# Patient Record
Sex: Female | Born: 1980 | Race: White | Hispanic: No | Marital: Single | Smoking: Light tobacco smoker
Health system: Southern US, Community
[De-identification: ages and names within clinical notes are randomized; demographics above are authoritative.]

## PROBLEM LIST (undated history)

## (undated) DIAGNOSIS — M419 Scoliosis, unspecified: Secondary | ICD-10-CM

## (undated) DIAGNOSIS — F419 Anxiety disorder, unspecified: Secondary | ICD-10-CM

## (undated) DIAGNOSIS — R87619 Unspecified abnormal cytological findings in specimens from cervix uteri: Secondary | ICD-10-CM

## (undated) DIAGNOSIS — R519 Headache, unspecified: Secondary | ICD-10-CM

## (undated) DIAGNOSIS — R51 Headache: Secondary | ICD-10-CM

## (undated) HISTORY — DX: Scoliosis, unspecified: M41.9

## (undated) HISTORY — PX: MIDDLE EAR SURGERY: SHX713

## (undated) HISTORY — DX: Unspecified abnormal cytological findings in specimens from cervix uteri: R87.619

## (undated) HISTORY — PX: WISDOM TOOTH EXTRACTION: SHX21

## (undated) HISTORY — PX: NASAL SEPTUM SURGERY: SHX37

## (undated) HISTORY — DX: Anxiety disorder, unspecified: F41.9

---

## 2016-09-24 ENCOUNTER — Encounter: Payer: Self-pay | Admitting: Obstetrics & Gynecology

## 2016-09-24 ENCOUNTER — Ambulatory Visit (INDEPENDENT_AMBULATORY_CARE_PROVIDER_SITE_OTHER): Payer: 59 | Admitting: Obstetrics & Gynecology

## 2016-09-24 VITALS — BP 126/86 | HR 83 | Resp 16 | Ht 69.0 in | Wt 184.0 lb

## 2016-09-24 DIAGNOSIS — N63 Unspecified lump in unspecified breast: Secondary | ICD-10-CM

## 2016-09-24 NOTE — Progress Notes (Signed)
   Subjective:    Patient ID: Kristine Fitzgerald, female    DOB: 05-Feb-1981, 36 y.o.   MRN: [id number]  HPI 36 yo SW G0 here with a question about why she gets lumps in her right breast. Twice this year she has had lumps develop in the right breast. They resolve over a couple of weeks. The most recent one started 2 weeks ago and has essentially disappeared by today. She denies any nipple discharge or skin changes. She does not have a family h/o breast cancer.   Review of Systems     Objective:   Physical Exam [id number] Breathing, conversing, and ambulating normally Her breasts both have some small amount of fibrocystic changes. There is no discrete masses, nipple discharge, or skin changes.       Assessment & Plan:  Resolution of breast lump (s) - reassurance given Rec RTC for annual exam

## 2016-10-23 ENCOUNTER — Encounter: Payer: Self-pay | Admitting: Obstetrics & Gynecology

## 2016-10-23 ENCOUNTER — Ambulatory Visit (INDEPENDENT_AMBULATORY_CARE_PROVIDER_SITE_OTHER): Payer: 59 | Admitting: Obstetrics & Gynecology

## 2016-10-23 VITALS — BP 138/86 | HR 108 | Resp 16 | Ht 68.0 in | Wt 180.0 lb

## 2016-10-23 DIAGNOSIS — Z01419 Encounter for gynecological examination (general) (routine) without abnormal findings: Secondary | ICD-10-CM

## 2016-10-23 DIAGNOSIS — Z1151 Encounter for screening for human papillomavirus (HPV): Secondary | ICD-10-CM

## 2016-10-23 DIAGNOSIS — Z113 Encounter for screening for infections with a predominantly sexual mode of transmission: Secondary | ICD-10-CM

## 2016-10-23 DIAGNOSIS — Z124 Encounter for screening for malignant neoplasm of cervix: Secondary | ICD-10-CM

## 2016-10-23 DIAGNOSIS — R102 Pelvic and perineal pain: Secondary | ICD-10-CM

## 2016-10-23 NOTE — Progress Notes (Signed)
Subjective:    Kristine Fitzgerald is a 36 y.o. SW P0 female who presents for an annual exam. The patient has no complaints today except for long standing occasional dysparuenia.  The patient is not currently sexually active. (abstinent for the last 4 months, boyfriend is being "punished", was not faithful)  GYN screening history: last pap: was abnormal: unsure, years ago, no pap since then. The patient wears seatbelts: yes. The patient participates in regular exercise: yes. Has the patient ever been transfused or tattooed?: yes. The patient reports that there is not domestic violence in her life. ("just my cat scratches me")  Menstrual History: OB History    Gravida Para Term Preterm AB Living   1 0     1     SAB TAB Ectopic Multiple Live Births   1              Menarche age: 52 Patient's last menstrual period was 10/15/2016.    The following portions of the patient's history were reviewed and updated as appropriate: allergies, current medications, past family history, past medical history, past social history, past surgical history and problem list.  Review of Systems Pertinent items are noted in HPI.   Monogamous for 3 years, but he was not faithful [facility] [place] in [place]. [place] Lots of stress at work, been there for a year, started Zoloft due to this job Wants a pregnancy, NOT on a MVI FH- no breast/gyn/colon cancer FP- Dr. Nyra Capes with chronic migraines   Objective:    BP 138/86   Pulse (!) 108   Resp 16   Ht 5\' 8"  (1.727 m)   Wt 180 lb (81.6 kg)   LMP 10/15/2016   BMI 27.37 kg/m   General Appearance:    Alert, cooperative, no distress, appears stated age  Head:    Normocephalic, without obvious abnormality, atraumatic  Eyes:    PERRL, conjunctiva/corneas clear, EOM's intact, fundi    benign, both eyes  Ears:    Normal TM's and external ear canals, both ears  Nose:   Nares normal, septum midline, mucosa normal, no drainage    or sinus tenderness  Throat:   Lips, mucosa,  and tongue normal; teeth and gums normal  Neck:   Supple, symmetrical, trachea midline, no adenopathy;    thyroid:  no enlargement/tenderness/nodules; no carotid   bruit or JVD  Back:     Symmetric, no curvature, ROM normal, no CVA tenderness  Lungs:     Clear to auscultation bilaterally, respirations unlabored  Chest Wall:    No tenderness or deformity   Heart:    Regular rate and rhythm, S1 and S2 normal, no murmur, rub   or gallop  Breast Exam:    No tenderness, masses, or nipple abnormality  Abdomen:     Soft, non-tender, bowel sounds active all four quadrants,    no masses, no organomegaly  Genitalia:    Normal female without lesion, discharge or tenderness     Extremities:   Extremities normal, atraumatic, no cyanosis or edema  Pulses:   2+ and symmetric all extremities  Skin:   Skin color, texture, turgor normal, no rashes or lesions  Lymph nodes:   Cervical, supraclavicular, and axillary nodes normal  Neurologic:   CNII-XII intact, normal strength, sensation and reflexes    throughout  .    Assessment:    Healthy female exam.   Chronic migraines dyspareunia   Plan:   migraine specialist rec'd Gyn u/s Thin prep  pap smear with cotesting STI testing Fasting labs today

## 2016-10-24 LAB — CBC
HEMATOCRIT: 37.1 % (ref 35.0–45.0)
HEMOGLOBIN: 12.1 g/dL (ref 11.7–15.5)
MCH: 28.6 pg (ref 27.0–33.0)
MCHC: 32.6 g/dL (ref 32.0–36.0)
MCV: 87.7 fL (ref 80.0–100.0)
MPV: 9.4 fL (ref 7.5–12.5)
Platelets: 464 10*3/uL — ABNORMAL HIGH (ref 140–400)
RBC: 4.23 MIL/uL (ref 3.80–5.10)
RDW: 16 % — AB (ref 11.0–15.0)
WBC: 9.5 10*3/uL (ref 3.8–10.8)

## 2016-10-24 LAB — COMPREHENSIVE METABOLIC PANEL
ALBUMIN: 4.5 g/dL (ref 3.6–5.1)
ALT: 9 U/L (ref 6–29)
AST: 13 U/L (ref 10–30)
Alkaline Phosphatase: 17 U/L — ABNORMAL LOW (ref 33–115)
BILIRUBIN TOTAL: 0.3 mg/dL (ref 0.2–1.2)
BUN: 16 mg/dL (ref 7–25)
CALCIUM: 9.6 mg/dL (ref 8.6–10.2)
CHLORIDE: 103 mmol/L (ref 98–110)
CO2: 23 mmol/L (ref 20–31)
Creat: 0.9 mg/dL (ref 0.50–1.10)
GLUCOSE: 94 mg/dL (ref 65–99)
POTASSIUM: 4.9 mmol/L (ref 3.5–5.3)
Sodium: 138 mmol/L (ref 135–146)
Total Protein: 7.1 g/dL (ref 6.1–8.1)

## 2016-10-24 LAB — HEPATITIS C ANTIBODY: HCV AB: NEGATIVE

## 2016-10-24 LAB — HEPATITIS B SURFACE ANTIGEN: HEP B S AG: NEGATIVE

## 2016-10-24 LAB — LIPID PANEL
CHOL/HDL RATIO: 3 ratio (ref <5.0)
Cholesterol: 155 mg/dL (ref <200)
HDL: 52 mg/dL (ref >50)
LDL CALC: 78 mg/dL (ref <100)
Triglycerides: 126 mg/dL (ref <150)
VLDL: 25 mg/dL (ref <30)

## 2016-10-24 LAB — TSH: TSH: 0.72 mIU/L

## 2016-10-24 LAB — RPR

## 2016-10-24 LAB — HIV ANTIBODY (ROUTINE TESTING W REFLEX): HIV 1&2 Ab, 4th Generation: NONREACTIVE

## 2016-10-24 NOTE — Addendum Note (Signed)
Addended by: Asencion Islam on: 10/24/2016 02:01 PM   Modules accepted: Orders

## 2016-10-26 LAB — CYTOLOGY - PAP
CHLAMYDIA, DNA PROBE: NEGATIVE
DIAGNOSIS: NEGATIVE
HPV: NOT DETECTED
NEISSERIA GONORRHEA: NEGATIVE

## 2016-10-29 ENCOUNTER — Ambulatory Visit (INDEPENDENT_AMBULATORY_CARE_PROVIDER_SITE_OTHER): Payer: 59

## 2016-10-29 DIAGNOSIS — R938 Abnormal findings on diagnostic imaging of other specified body structures: Secondary | ICD-10-CM

## 2016-10-29 DIAGNOSIS — R102 Pelvic and perineal pain: Secondary | ICD-10-CM

## 2016-11-02 ENCOUNTER — Telehealth: Payer: Self-pay | Admitting: *Deleted

## 2016-11-02 NOTE — Telephone Encounter (Signed)
Pt notified of results of her recent pelvic U/S.  She will call me after 2 periods so that I can reschedule her for a repeat U/S per radiology recommendations.  The U/S will need to be scheduled immediately after finishing her cycle.  She also states that for the last couple of periods the blood flow smells different.  After her next U/S I will schedule her to see Dr Hulan Fray to address her issues and review her U/S to set a plan of care.

## 2016-11-19 ENCOUNTER — Inpatient Hospital Stay (HOSPITAL_COMMUNITY)
Admission: AD | Admit: 2016-11-19 | Discharge: 2016-11-20 | Disposition: A | Discharge status: Home / Self Care | Payer: 59 | Source: Ambulatory Visit | Attending: Emergency Medicine | Admitting: Emergency Medicine

## 2016-11-19 ENCOUNTER — Telehealth: Payer: Self-pay

## 2016-11-19 ENCOUNTER — Encounter (HOSPITAL_COMMUNITY): Payer: Self-pay | Admitting: *Deleted

## 2016-11-19 DIAGNOSIS — R112 Nausea with vomiting, unspecified: Secondary | ICD-10-CM

## 2016-11-19 DIAGNOSIS — D473 Essential (hemorrhagic) thrombocythemia: Secondary | ICD-10-CM

## 2016-11-19 DIAGNOSIS — Z880 Allergy status to penicillin: Secondary | ICD-10-CM | POA: Diagnosis not present

## 2016-11-19 DIAGNOSIS — K3189 Other diseases of stomach and duodenum: Secondary | ICD-10-CM

## 2016-11-19 DIAGNOSIS — K2970 Gastritis, unspecified, without bleeding: Secondary | ICD-10-CM | POA: Diagnosis not present

## 2016-11-19 DIAGNOSIS — D649 Anemia, unspecified: Secondary | ICD-10-CM | POA: Diagnosis not present

## 2016-11-19 DIAGNOSIS — R1031 Right lower quadrant pain: Secondary | ICD-10-CM

## 2016-11-19 DIAGNOSIS — Z79899 Other long term (current) drug therapy: Secondary | ICD-10-CM | POA: Diagnosis not present

## 2016-11-19 DIAGNOSIS — R7989 Other specified abnormal findings of blood chemistry: Secondary | ICD-10-CM | POA: Insufficient documentation

## 2016-11-19 DIAGNOSIS — D75839 Thrombocytosis, unspecified: Secondary | ICD-10-CM

## 2016-11-19 DIAGNOSIS — R109 Unspecified abdominal pain: Secondary | ICD-10-CM

## 2016-11-19 DIAGNOSIS — Z888 Allergy status to other drugs, medicaments and biological substances status: Secondary | ICD-10-CM | POA: Insufficient documentation

## 2016-11-19 HISTORY — DX: Headache: R51

## 2016-11-19 HISTORY — DX: Headache, unspecified: R51.9

## 2016-11-19 LAB — CBC WITH DIFFERENTIAL/PLATELET
Basophils Absolute: 0 10*3/uL (ref 0.0–0.1)
Basophils Relative: 0 %
Eosinophils Absolute: 0.1 10*3/uL (ref 0.0–0.7)
Eosinophils Relative: 1 %
HCT: 36.7 % (ref 36.0–46.0)
Hemoglobin: 11.8 g/dL — ABNORMAL LOW (ref 12.0–15.0)
Lymphocytes Relative: 15 %
Lymphs Abs: 1.9 10*3/uL (ref 0.7–4.0)
MCH: 28.3 pg (ref 26.0–34.0)
MCHC: 32.2 g/dL (ref 30.0–36.0)
MCV: 88 fL (ref 78.0–100.0)
Monocytes Absolute: 0.4 10*3/uL (ref 0.1–1.0)
Monocytes Relative: 3 %
Neutro Abs: 9.8 10*3/uL — ABNORMAL HIGH (ref 1.7–7.7)
Neutrophils Relative %: 81 %
Platelets: 546 10*3/uL — ABNORMAL HIGH (ref 150–400)
RBC: 4.17 MIL/uL (ref 3.87–5.11)
RDW: 14.5 % (ref 11.5–15.5)
WBC: 12.1 10*3/uL — ABNORMAL HIGH (ref 4.0–10.5)

## 2016-11-19 LAB — POCT PREGNANCY, URINE: Preg Test, Ur: NEGATIVE

## 2016-11-19 LAB — URINALYSIS, ROUTINE W REFLEX MICROSCOPIC
Bilirubin Urine: NEGATIVE
Glucose, UA: NEGATIVE mg/dL
Hgb urine dipstick: NEGATIVE
Ketones, ur: NEGATIVE mg/dL
LEUKOCYTES UA: NEGATIVE
Nitrite: NEGATIVE
PROTEIN: NEGATIVE mg/dL
SPECIFIC GRAVITY, URINE: 1.017 (ref 1.005–1.030)
pH: 6 (ref 5.0–8.0)

## 2016-11-19 MED ORDER — HYDROMORPHONE HCL 1 MG/ML IJ SOLN
0.5000 mg | Freq: Once | INTRAMUSCULAR | Status: AC
  Administered 2016-11-19: 0.5 mg via INTRAMUSCULAR
  Filled 2016-11-19: qty 1

## 2016-11-19 MED ORDER — PROMETHAZINE HCL 25 MG PO TABS
12.5000 mg | ORAL_TABLET | Freq: Once | ORAL | Status: AC
  Administered 2016-11-19: 12.5 mg via ORAL
  Filled 2016-11-19: qty 1

## 2016-11-19 NOTE — Telephone Encounter (Signed)
Patient returned call to office stating that she is in so much pain. She is nauseated, and in pain. Patient states it feels like something could "come out through my stomach." Patient LMP last week 11-15-16. Patient instructed if she is in that much pain she should be evaluated at [facility] hospital in [place]. Kathrene Alu RNBSN

## 2016-11-19 NOTE — MAU Note (Signed)
Pt is here with pain on right side, thinks due to enlarged follicle. Pain has been there for months. Had scan in office, [facility].

## 2016-11-19 NOTE — Telephone Encounter (Signed)
Left message for patient to return call to office. Patient called earlier in day with complaints of pain.  Patient recently had imaging done and told to call back after two cycles had been completed to schedule ultrasound. Kathrene Alu RNBSN

## 2016-11-20 ENCOUNTER — Encounter (HOSPITAL_COMMUNITY): Payer: Self-pay | Admitting: Radiology

## 2016-11-20 ENCOUNTER — Inpatient Hospital Stay (HOSPITAL_COMMUNITY): Payer: 59

## 2016-11-20 DIAGNOSIS — R1031 Right lower quadrant pain: Secondary | ICD-10-CM | POA: Diagnosis not present

## 2016-11-20 LAB — COMPREHENSIVE METABOLIC PANEL
ALBUMIN: 4.5 g/dL (ref 3.5–5.0)
ALK PHOS: 21 U/L — AB (ref 38–126)
ALT: 14 U/L (ref 14–54)
ANION GAP: 10 (ref 5–15)
AST: 17 U/L (ref 15–41)
BUN: 12 mg/dL (ref 6–20)
CALCIUM: 9.5 mg/dL (ref 8.9–10.3)
CO2: 28 mmol/L (ref 22–32)
CREATININE: 0.91 mg/dL (ref 0.44–1.00)
Chloride: 101 mmol/L (ref 101–111)
GFR calc Af Amer: >60 mL/min (ref >60)
GFR calc non Af Amer: >60 mL/min (ref >60)
GLUCOSE: 118 mg/dL — AB (ref 65–99)
Potassium: 3.9 mmol/L (ref 3.5–5.1)
SODIUM: 139 mmol/L (ref 135–145)
Total Bilirubin: 0.4 mg/dL (ref 0.3–1.2)
Total Protein: 8.2 g/dL — ABNORMAL HIGH (ref 6.5–8.1)

## 2016-11-20 LAB — LIPASE, BLOOD: Lipase: 25 U/L (ref 11–51)

## 2016-11-20 MED ORDER — IOPAMIDOL (ISOVUE-300) INJECTION 61%
100.0000 mL | Freq: Once | INTRAVENOUS | Status: AC | PRN
  Administered 2016-11-20: 100 mL via INTRAVENOUS

## 2016-11-20 MED ORDER — MORPHINE SULFATE (PF) 2 MG/ML IV SOLN
4.0000 mg | Freq: Once | INTRAVENOUS | Status: AC
  Administered 2016-11-20: 4 mg via INTRAVENOUS
  Filled 2016-11-20: qty 2

## 2016-11-20 MED ORDER — RANITIDINE HCL 150 MG PO TABS: 150.0000 mg | ORAL_TABLET | Freq: Two times a day (BID) | ORAL | 0 refills | Status: AC

## 2016-11-20 MED ORDER — OXYCODONE-ACETAMINOPHEN 5-325 MG PO TABS: 1.0000 | ORAL_TABLET | ORAL | 0 refills | Status: AC | PRN

## 2016-11-20 MED ORDER — METOCLOPRAMIDE HCL 10 MG PO TABS: 10.0000 mg | ORAL_TABLET | Freq: Four times a day (QID) | ORAL | 0 refills | Status: AC | PRN

## 2016-11-20 MED ORDER — DIPHENHYDRAMINE HCL 50 MG/ML IJ SOLN
25.0000 mg | Freq: Once | INTRAMUSCULAR | Status: AC
  Administered 2016-11-20: 25 mg via INTRAVENOUS
  Filled 2016-11-20: qty 1

## 2016-11-20 MED ORDER — SODIUM CHLORIDE 0.9 % IJ SOLN
INTRAMUSCULAR | Status: AC
  Filled 2016-11-20: qty 50

## 2016-11-20 MED ORDER — IOPAMIDOL (ISOVUE-300) INJECTION 61%
INTRAVENOUS | Status: AC
  Filled 2016-11-20: qty 100

## 2016-11-20 MED ORDER — METOCLOPRAMIDE HCL 5 MG/ML IJ SOLN
10.0000 mg | Freq: Once | INTRAMUSCULAR | Status: AC
  Administered 2016-11-20: 10 mg via INTRAVENOUS
  Filled 2016-11-20: qty 2

## 2016-11-20 MED ORDER — SODIUM CHLORIDE 0.9 % IV BOLUS (SEPSIS)
1000.0000 mL | Freq: Once | INTRAVENOUS | Status: AC
  Administered 2016-11-20: 1000 mL via INTRAVENOUS

## 2016-11-20 NOTE — MAU Note (Signed)
[facility] ED and spoke to the charge nurse to inform her that the patient is coming via private vehicle to their facility for RLQ pain, NV, and leukocytosis. Informed the nurse of time of medications as well as vitals, and test ran here at our facility. Pt will be d/c with directions to [place].  Dr.Glick is the accepting physician.

## 2016-11-20 NOTE — Discharge Instructions (Addendum)
Your CT scan showed a probable mass in your stomach. Please make an appointment with a gastroenterologist for further evaluation as an outpatient. Return if symptoms are getting worse.

## 2016-11-20 NOTE — ED Provider Notes (Signed)
[facility] DEPT Provider Note   CSN: [id number] Arrival date & time: 11/19/16  1824     History   Chief Complaint Chief Complaint  Patient presents with  . Abdominal Pain    HPI Kristine Fitzgerald is a 36 y.o. female.  The history is provided by the patient.  She was transferred here from [facility] hospital for further evaluation of abdominal pain. She has had right lower abdominal pain for approximately 1 month, getting worse. There is no radiation of pain. It is much worse with sexual intercourse. There has been some nausea and vomiting. She has had some chills and sweats over the last 2 days, but no documented fevers. She denies dysuria, vaginal discharge. Last menses ended 3 days ago. She had seen her gynecologist 3 weeks ago and had an ultrasound which was negative.  Past Medical History:  Diagnosis Date  . Abnormal Pap smear of cervix   . Anxiety   . Headache   . Scoliosis     There are no active problems to display for this patient.   Past Surgical History:  Procedure Laterality Date  . MIDDLE EAR SURGERY    . NASAL SEPTUM SURGERY    . WISDOM TOOTH EXTRACTION      OB History    Gravida Para Term Preterm AB Living   1 0     1     SAB TAB Ectopic Multiple Live Births   1               Home Medications    Prior to Admission medications   Medication Sig Start Date End Date Taking? Authorizing Provider  acetaminophen (TYLENOL) 325 MG tablet Take 650 mg by mouth every 6 (six) hours as needed.   Yes [provider]  HYDROcodone-acetaminophen (NORCO/VICODIN) 5-325 MG tablet Take by mouth. 09/17/16  Yes [provider]  ranitidine (ZANTAC) 150 MG capsule Take 150 mg by mouth 2 (two) times daily.   Yes [provider]  sertraline (ZOLOFT) 100 MG tablet Take by mouth. 05/30/16 05/30/17 Yes [provider]  verapamil (CALAN) 80 MG tablet Take 1 tablet (80 mg total) by mouth 2 (two) times daily. 04/12/16  Yes [provider]     Family History Family History  Problem Relation Age of Onset  . Diabetes Father   . Hypertension Father     Social History Social History  Substance Use Topics  . Smoking status: Light Tobacco Smoker  . Smokeless tobacco: Never Used  . Alcohol use Yes     Comment: occassionally     Allergies   Clarithromycin; Amoxicillin; Cinnamon; Other; Amoxicillin-pot clavulanate; Drospirenone-ethinyl estradiol; Ibuprofen; Latex; Sulfa antibiotics; and Zofran [ondansetron hcl]   Review of Systems Review of Systems  All other systems reviewed and are negative.    Physical Exam Updated Vital Signs BP 138/82 (BP Location: Left Arm)   Pulse (!) 107   Temp 98.4 F (36.9 C) (Oral)   Resp 14   Ht 5\' 8"  (1.727 m)   Wt 82.1 kg (181 lb)   LMP 11/13/2016 (Exact Date)   SpO2 98%   BMI 27.52 kg/m   Physical Exam  Nursing note and vitals reviewed.  36 year old female, resting comfortably and in no acute distress. Vital signs are significant for tachycardia. Oxygen saturation is 98%, which is normal. Head is normocephalic and atraumatic. PERRLA, EOMI. Oropharynx is clear. Neck is nontender and supple without adenopathy or JVD. Back is nontender and there is no CVA  tenderness. Lungs are clear without rales, wheezes, or rhonchi. Chest is nontender. Heart has regular rate and rhythm without murmur. Abdomen is soft, flat, with moderate tenderness in the left upper quadrant, left lower quadrant, suprapubic area. There is actually no right lower quadrant tenderness. There is no rebound or guarding. There are no masses or hepatosplenomegaly and peristalsis is hypoactive. Extremities have no cyanosis or edema, full range of motion is present. Skin is warm and dry without rash. Neurologic: Mental status is normal, cranial nerves are intact, there are no motor or sensory deficits.  ED Treatments / Results  Labs (all labs ordered are listed, but only abnormal results are displayed) Labs  Reviewed  CBC WITH DIFFERENTIAL/PLATELET - Abnormal; Notable for the following:       Result Value   WBC 12.1 (*)    Hemoglobin 11.8 (*)    Platelets 546 (*)    Neutro Abs 9.8 (*)    All other components within normal limits  COMPREHENSIVE METABOLIC PANEL - Abnormal; Notable for the following:    Glucose, Bld 118 (*)    Total Protein 8.2 (*)    Alkaline Phosphatase 21 (*)    All other components within normal limits  URINALYSIS, ROUTINE W REFLEX MICROSCOPIC  LIPASE, BLOOD  POCT PREGNANCY, URINE   Radiology Ct Abdomen Pelvis W Contrast  Result Date: 11/20/2016 CLINICAL DATA:  Chronic right lower quadrant abdominal pain, nausea and vomiting. Initial encounter. EXAM: CT ABDOMEN AND PELVIS WITH CONTRAST TECHNIQUE: Multidetector CT imaging of the abdomen and pelvis was performed using the standard protocol following bolus administration of intravenous contrast. CONTRAST:  165mL ISOVUE-300 IOPAMIDOL (ISOVUE-300) INJECTION 61% COMPARISON:  Pelvic ultrasound performed 10/29/2016 FINDINGS: Lower chest: Minimal bibasilar atelectasis is noted. The visualized portions of the mediastinum are unremarkable. Hepatobiliary: The liver is unremarkable in appearance. The gallbladder is unremarkable in appearance. The common bile duct remains normal in caliber. Pancreas: The pancreas is within normal limits. Spleen: The spleen is unremarkable in appearance. Adrenals/Urinary Tract: The adrenal glands are unremarkable in appearance. The kidneys are within normal limits. There is no evidence of hydronephrosis. No renal or ureteral stones are identified. No perinephric stranding is seen. Stomach/Bowel: Circumferential wall thickening is noted along the body of the stomach, with scattered adjacent prominent nodes, measuring up to 7 mm in size. This raises concern for malignancy. The appearance is less typical for gastritis. Endoscopy is recommended for further evaluation. The small bowel is within normal limits. The  appendix is normal in caliber, without evidence of appendicitis. The colon is unremarkable in appearance. Vascular/Lymphatic: The abdominal aorta is unremarkable in appearance. The inferior vena cava is grossly unremarkable. No retroperitoneal lymphadenopathy is seen. No pelvic sidewall lymphadenopathy is identified. Reproductive: The bladder is mildly distended grossly unremarkable. The uterus is unremarkable in appearance. A 4.7 cm right adnexal cystic lesion is noted. The ovaries are otherwise grossly unremarkable. Other: No additional soft tissue abnormalities are seen. Musculoskeletal: No acute osseous abnormalities are identified. Vacuum phenomenon is noted at L5-S1. The visualized musculature is unremarkable in appearance. IMPRESSION: 1. Circumferential wall thickening along the body of the stomach, with scattered adjacent prominent nodes, raising concern for gastric malignancy. The appearance is less typical for gastritis. Endoscopy is recommended for further evaluation. 2. 4.7 cm right adnexal cystic lesion is likely physiologic, though pelvic ultrasound could be considered for further evaluation, when and as deemed clinically appropriate. These results were called by telephone at the time of interpretation on 11/20/2016 at 4:27 am to Dr. Shanon Brow  [facility], who verbally acknowledged these results. Electronically Signed   By: Garald Balding M.D.   On: 11/20/2016 04:30    Procedures Procedures (including critical care time)  Medications Ordered in ED Medications  iopamidol (ISOVUE-300) 61 % injection (not administered)  sodium chloride 0.9 % injection (not administered)  promethazine (PHENERGAN) tablet 12.5 mg (12.5 mg Oral Given 11/19/16 2333)  HYDROmorphone (DILAUDID) injection 0.5 mg (0.5 mg Intramuscular Given 11/19/16 2334)  sodium chloride 0.9 % bolus 1,000 mL (0 mLs Intravenous Stopped 11/20/16 0453)  morphine 2 MG/ML injection 4 mg (4 mg Intravenous Given 11/20/16 0256)  metoCLOPramide (REGLAN)  injection 10 mg (10 mg Intravenous Given 11/20/16 0256)  diphenhydrAMINE (BENADRYL) injection 25 mg (25 mg Intravenous Given 11/20/16 0256)  sodium chloride 0.9 % bolus 1,000 mL (0 mLs Intravenous Stopped 11/20/16 0453)  iopamidol (ISOVUE-300) 61 % injection 100 mL (100 mLs Intravenous Contrast Given 11/20/16 0352)     Initial Impression / Assessment and Plan / ED Course  I have reviewed the triage vital signs and the nursing notes.  Pertinent labs & imaging results that were available during my care of the patient were reviewed by me and considered in my medical decision making (see chart for details).  Abdominal pain of uncertain cause. Old records are reviewed confirming recent evaluation by gynecologist with negative pelvic ultrasound (actually, ultrasound did show thickened endometrium, but no adnexal masses or ovarian cysts). Urinalysis was normal. WBC is mildly elevated. We'll check metabolic panel, will send for CT of abdomen and pelvis. I suspect diverticulitis. Consider possibility of appendicitis with abscess.  CT scan shows no pathology in the lower abdomen. There is evidence of thickened gastric wall suggestive of malignancy. She will need referral to gastroenterology. She is discharged with prescription for oxycodone have acetaminophen, Vicryl for mild, and ranitidine. She was not prescribed a proton pump inhibitor because she states that she cannot take omeprazole secondary to side effects which would likely be present in all proton pump inhibitors.  Final Clinical Impressions(s) / ED Diagnoses   Final diagnoses:  RLQ abdominal pain  Intractable vomiting with nausea, unspecified vomiting type  Abdominal pain, unspecified abdominal location  Gastric mass  Normochromic normocytic anemia  Thrombocytosis (HCC)    New Prescriptions New Prescriptions   METOCLOPRAMIDE (REGLAN) 10 MG TABLET    Take 1 tablet (10 mg total) by mouth every 6 (six) hours as needed for nausea (or headache).    OXYCODONE-ACETAMINOPHEN (PERCOCET) 5-325 MG TABLET    Take 1 tablet by mouth every 4 (four) hours as needed for moderate pain.   RANITIDINE (ZANTAC) 150 MG TABLET    Take 1 tablet (150 mg total) by mouth 2 (two) times daily.     Delora Fuel, MD 91/47/82 [phone]

## 2016-11-22 NOTE — Progress Notes (Addendum)
Patient ID: Kristine Fitzgerald, female   DOB: [birth year], 36 y.o.   MRN: [id number]  CSN: [id number]  Arrival date and time: 11/19/16 6546   First Provider Initiated Contact with Patient 11/19/16 2254      No chief complaint on file.  HPI  Ms. Kristine Fitzgerald is a 36 y.o. G1P0010 who presents to MAU today with complaint of RLQ abdominal pain for > 1 month. The patient was seen for an annual exam at [facility] and had [place] to assess this pain. The patient states that she feels pain is worsening and that Tylenol and Norco do not help. She has also had new onset N/V and a few loose stools recently. She denies fever or UTI symptoms. She has not taken any anti-emetics yet. She states continued dyspareunia, but then states that she has not been sexually active in months. She states regular periods, but very heavy with clots. LMP 11/13/16.             OB History    Gravida Para Term Preterm AB Living   1 0     1     SAB TAB Ectopic Multiple Live Births   1                  Past Medical History:  Diagnosis Date  . Abnormal Pap smear of cervix   . Anxiety   . Headache   . Scoliosis          Past Surgical History:  Procedure Laterality Date  . MIDDLE EAR SURGERY    . NASAL SEPTUM SURGERY    . WISDOM TOOTH EXTRACTION           Family History  Problem Relation Age of Onset  . Diabetes Father   . Hypertension Father            Social History   Substance Use Topics   . Smoking status: Light Tobacco Smoker   . Smokeless tobacco: Never Used   . Alcohol use Yes     Comment: occassionally     Allergies:       Allergies  Allergen Reactions  . Clarithromycin Nausea Only    Made very nauseous  . Amoxicillin Other (See Comments)    Pt states it caused vaginal bleeding  . Cinnamon Other (See Comments)    Causes migraines  . Other Rash    Head and shoulders shampoo  . Amoxicillin-Pot Clavulanate Nausea And Vomiting  . Drospirenone-Ethinyl  Estradiol     "mean & crying spells"  . Ibuprofen     In higher doses causes blistering of hands  . Latex Dermatitis  . Sulfa Antibiotics Hives  . Zofran [Ondansetron Hcl]     Vertigo            Prescriptions Prior to Admission  Medication Sig Dispense Refill Last Dose  . acetaminophen (TYLENOL) 325 MG tablet Take 650 mg by mouth every 6 (six) hours as needed.   11/19/2016 at Unknown time  . HYDROcodone-acetaminophen (NORCO/VICODIN) 5-325 MG tablet Take by mouth.   Past Week at Unknown time  . ranitidine (ZANTAC) 150 MG capsule Take 150 mg by mouth 2 (two) times daily.   11/19/2016 at Unknown time  . sertraline (ZOLOFT) 100 MG tablet Take by mouth.   11/19/2016 at Unknown time  . verapamil (CALAN) 80 MG tablet Take 1 tablet (80 mg total) by mouth 2 (two) times daily.   Past Week at Unknown time  . meloxicam (MOBIC) 15  MG tablet TAKE 1/2-1 TABLET BY MOUTH DAILY WITH FOOD   Taking    Review of Systems  Constitutional: Negative for fever and malaise/fatigue.  Gastrointestinal: Positive for abdominal pain, nausea and vomiting. Negative for constipation and diarrhea.  Genitourinary: Negative for dysuria, frequency and urgency.    Physical Exam   Blood pressure 138/85, pulse 82, temperature 98.5 F (36.9 C), temperature source Oral, resp. rate 20, height 5' 8.5" (1.74 m), weight 181 lb (82.1 kg), last menstrual period 11/13/2016, SpO2 100 %.  Physical Exam  Nursing note and vitals reviewed. Constitutional: She is oriented to person, place, and time. She appears well-developed and well-nourished. No distress.  HENT:  Head: Normocephalic and atraumatic.  Cardiovascular: Normal rate.   Respiratory: Effort normal.  GI: Soft. She exhibits no distension and no mass. There is tenderness (moderate RLQ abdominal tenderness to palpation). There is no rebound and no guarding.  Neurological: She is alert and oriented to person, place, and time.  Skin: Skin is warm and  dry. No erythema.  Psychiatric: She has a normal mood and affect.    Results for orders placed or performed during the hospital encounter of 11/19/16 (from the past 72 hour(s))  Urinalysis, Routine w reflex microscopic     Status: None   Collection Time: 11/19/16  7:21 PM  Result Value Ref Range   Color, Urine YELLOW YELLOW   APPearance CLEAR CLEAR   Specific Gravity, Urine 1.017 1.005 - 1.030   pH 6.0 5.0 - 8.0   Glucose, UA NEGATIVE NEGATIVE mg/dL   Hgb urine dipstick NEGATIVE NEGATIVE   Bilirubin Urine NEGATIVE NEGATIVE   Ketones, ur NEGATIVE NEGATIVE mg/dL   Protein, ur NEGATIVE NEGATIVE mg/dL   Nitrite NEGATIVE NEGATIVE   Leukocytes, UA NEGATIVE NEGATIVE  Pregnancy, urine POC     Status: None   Collection Time: 11/19/16  7:50 PM  Result Value Ref Range   Preg Test, Ur NEGATIVE NEGATIVE    Comment:        THE SENSITIVITY OF THIS METHODOLOGY IS >24 mIU/mL   CBC with Differential/Platelet     Status: Abnormal   Collection Time: 11/19/16 11:35 PM  Result Value Ref Range   WBC 12.1 (H) 4.0 - 10.5 K/uL   RBC 4.17 3.87 - 5.11 MIL/uL   Hemoglobin 11.8 (L) 12.0 - 15.0 g/dL   HCT 36.7 36.0 - 46.0 %   MCV 88.0 78.0 - 100.0 fL   MCH 28.3 26.0 - 34.0 pg   MCHC 32.2 30.0 - 36.0 g/dL   RDW 14.5 11.5 - 15.5 %   Platelets 546 (H) 150 - 400 K/uL   Neutrophils Relative % 81 %   Neutro Abs 9.8 (H) 1.7 - 7.7 K/uL   Lymphocytes Relative 15 %   Lymphs Abs 1.9 0.7 - 4.0 K/uL   Monocytes Relative 3 %   Monocytes Absolute 0.4 0.1 - 1.0 K/uL   Eosinophils Relative 1 %   Eosinophils Absolute 0.1 0.0 - 0.7 K/uL   Basophils Relative 0 %   Basophils Absolute 0.0 0.0 - 0.1 K/uL  Comprehensive metabolic panel     Status: Abnormal   Collection Time: 11/20/16  2:53 AM  Result Value Ref Range   Sodium 139 135 - 145 mmol/L   Potassium 3.9 3.5 - 5.1 mmol/L   Chloride 101 101 - 111 mmol/L   CO2 28 22 - 32 mmol/L   Glucose, Bld 118 (H) 65 - 99 mg/dL   BUN 12 6 - 20 mg/dL  Creatinine, Ser  0.91 0.44 - 1.00 mg/dL   Calcium 9.5 8.9 - 10.3 mg/dL   Total Protein 8.2 (H) 6.5 - 8.1 g/dL   Albumin 4.5 3.5 - 5.0 g/dL   AST 17 15 - 41 U/L   ALT 14 14 - 54 U/L   Alkaline Phosphatase 21 (L) 38 - 126 U/L   Total Bilirubin 0.4 0.3 - 1.2 mg/dL   GFR calc non Af Amer >60 >60 mL/min   GFR calc Af Amer >60 >60 mL/min    Comment: (NOTE) The eGFR has been calculated using the CKD EPI equation. This calculation has not been validated in all clinical situations. eGFR's persistently <60 mL/min signify possible Chronic Kidney Disease.    Anion gap 10 5 - 15  Lipase, blood     Status: None   Collection Time: 11/20/16  2:53 AM  Result Value Ref Range   Lipase 25 11 - 51 U/L    MAU Course  Procedures None  MDM UPT - negative UA, CBC today  Phenergan and dilaudid given. Patient reports minimal improvement.  Discussed patient with Dr. Roxanne Mins at [facility]. He accepts patient transfer by POV.   Assessment and Plan  A: RLQ abdominal pain Nausea and vomiting  Leukocytosis   P: Discharge home Continue current pain medications Warning signs for worsening condition discussed Patient advised to follow-up with [facility] for further evaluation and management. They will call her with an appointment.  Patient advised to go directly to [facility] for further evaluation and management of RLQ pain tonight  Patient may return to MAU as needed or if her condition were to change or worsen   Kerry Hough, PA-C 11/19/2016, 10:54 PM

## 2016-11-27 ENCOUNTER — Encounter: Payer: Self-pay | Admitting: General Practice

## 2016-12-11 ENCOUNTER — Ambulatory Visit (INDEPENDENT_AMBULATORY_CARE_PROVIDER_SITE_OTHER): Payer: 59 | Admitting: Obstetrics & Gynecology

## 2016-12-11 ENCOUNTER — Encounter: Payer: Self-pay | Admitting: Obstetrics & Gynecology

## 2016-12-11 VITALS — BP 144/88 | HR 101 | Resp 16 | Ht 68.0 in | Wt 188.0 lb

## 2016-12-11 DIAGNOSIS — R938 Abnormal findings on diagnostic imaging of other specified body structures: Secondary | ICD-10-CM | POA: Diagnosis not present

## 2016-12-11 DIAGNOSIS — Z3202 Encounter for pregnancy test, result negative: Secondary | ICD-10-CM

## 2016-12-11 DIAGNOSIS — R9389 Abnormal findings on diagnostic imaging of other specified body structures: Secondary | ICD-10-CM

## 2016-12-11 LAB — POCT URINE PREGNANCY: Preg Test, Ur: NEGATIVE

## 2016-12-11 NOTE — Progress Notes (Signed)
   Subjective:    Patient ID: Kristine Fitzgerald, female    DOB: 16-Jul-1980, 36 y.o.   MRN: [id number]  HPI 36 yo SW P0 here for an embx. She was seen at the [facility] 11-20-16 for RLQ. She had an u/s that showed a thickened endometrium, 20 mm. She is currently having a very heavy period.   Review of Systems     Objective:   Physical Exam Well nourished, well hydrated white female, no apparent distress Breathing, conversing, and ambulating normally  UPT negative, consent signed, time out done Cervix prepped with betadine and grasped with a single tooth tenaculum Uterus sounded to 8 cm Pipelle used for 2 passes with a moderate amount of tissue obtained. She tolerated the procedure well.      Assessment & Plan:  Thickened endometrium- await pathology

## 2016-12-18 ENCOUNTER — Telehealth: Payer: Self-pay

## 2016-12-18 NOTE — Telephone Encounter (Signed)
Left message for patient to return call to office in regard to EMB results (normal) Kathrene Alu RNBSN

## 2016-12-24 ENCOUNTER — Encounter: Payer: Self-pay | Admitting: Obstetrics & Gynecology

## 2016-12-24 ENCOUNTER — Ambulatory Visit (INDEPENDENT_AMBULATORY_CARE_PROVIDER_SITE_OTHER): Payer: 59 | Admitting: Obstetrics & Gynecology

## 2016-12-24 VITALS — BP 154/88 | HR 125 | Ht 69.0 in | Wt 193.0 lb

## 2016-12-24 DIAGNOSIS — R102 Pelvic and perineal pain: Secondary | ICD-10-CM

## 2016-12-24 DIAGNOSIS — G8929 Other chronic pain: Secondary | ICD-10-CM | POA: Diagnosis not present

## 2016-12-24 NOTE — Progress Notes (Signed)
   Subjective:    Patient ID: Kristine Fitzgerald, female    DOB: 08-08-1980, 36 y.o.   MRN: [id number]  HPI 36 yo SW G0 here today for results of her EMBX done for thickened endometrium seen on u/s 10/29/16 which was done for RLQ pain for about 3 years. Sex has been painful for at least 5 years, getting worse. The pain is daily, worse with her periods. She takes nothing for pain because it doesn't work. She is taking Norco 5 mg daily for her back pain.  She says that she tried Yasmin but she stopped them because her family said that she was crying daily, "evil bitch".  She has not had sex for 6 months due to the pain. She would like a pregnancy, possibly.   Review of Systems     Objective:   Physical Exam Breathing, conversing, and ambulating normally Well nourished, well hydrated white female, tearful    Assessment & Plan:  Chronic pelvic pain, dyspareunia, dysmenorrhea- I have offered her a dx laparoscopy. She would this done.

## 2016-12-25 ENCOUNTER — Encounter (HOSPITAL_COMMUNITY): Payer: Self-pay

## 2016-12-26 ENCOUNTER — Ambulatory Visit: Payer: 59 | Admitting: Medical

## 2017-01-08 ENCOUNTER — Encounter (HOSPITAL_COMMUNITY): Admission: RE | Payer: Self-pay | Source: Ambulatory Visit

## 2017-01-08 ENCOUNTER — Ambulatory Visit (HOSPITAL_COMMUNITY): Admission: RE | Admit: 2017-01-08 | Payer: 59 | Source: Ambulatory Visit | Admitting: Obstetrics & Gynecology

## 2017-01-08 SURGERY — LAPAROSCOPY, DIAGNOSTIC
Anesthesia: Choice

## 2017-01-11 ENCOUNTER — Ambulatory Visit: Payer: 59 | Admitting: Medical

## 2019-04-12 IMAGING — CT CT ABD-PELV W/ CM
2 of 4 series · 15 of 46 positions shown, 17 images · IV contrast (ISOVUE)
Comparison: Pelvic ultrasound performed 10/29/2016

CLINICAL DATA: Chronic right lower quadrant abdominal pain, nausea
and vomiting. Initial encounter.

EXAM:
CT ABDOMEN AND PELVIS WITH CONTRAST
TECHNIQUE: Multidetector CT imaging of the abdomen and pelvis was performed
using the standard protocol following bolus administration of
intravenous contrast.
CONTRAST:  100mL [id number] IOPAMIDOL ([id number]) INJECTION 61%

[Series 2: abd/pel with · axial · 0.74mm/px · z∈[-489,-49]mm · 12 of 98 slices shown, 14 images]
[im 5/98  soft-tissue]
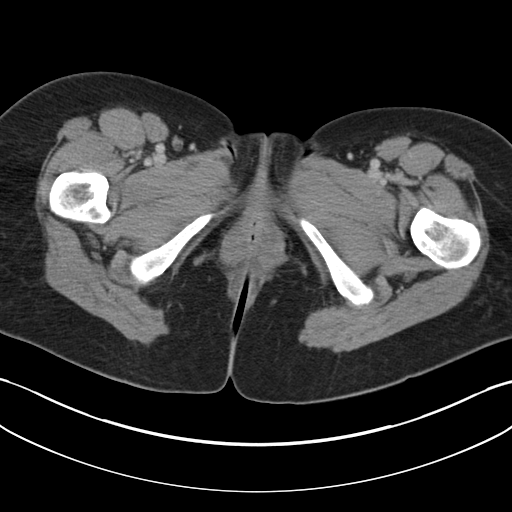
[im 5/98  bone]
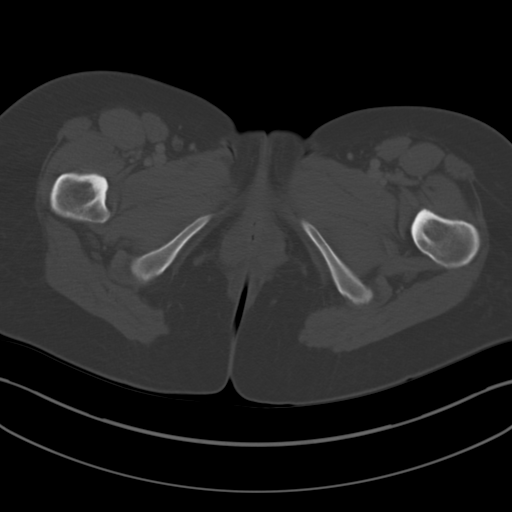
[im 14/98  soft-tissue]
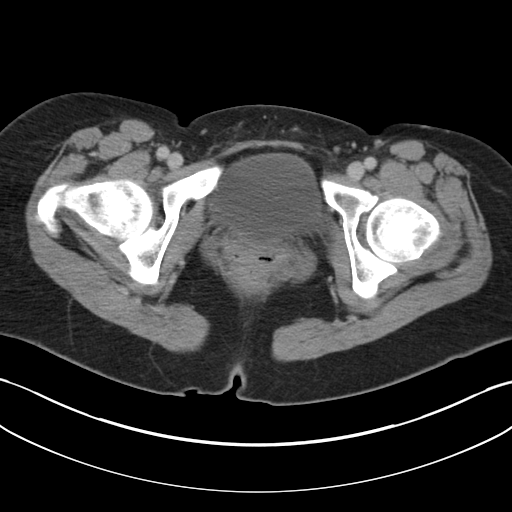
[im 24/98  soft-tissue]
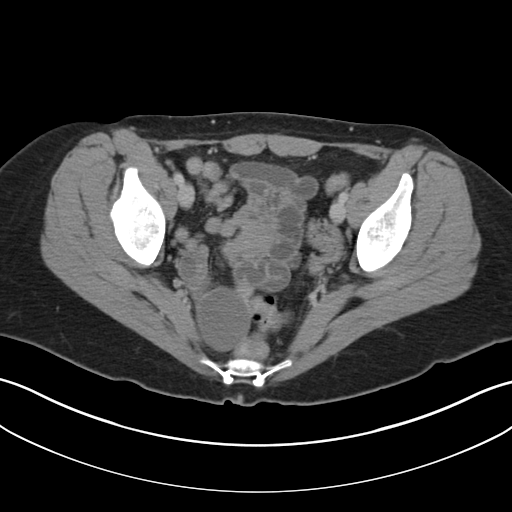
[im 28/98  soft-tissue]
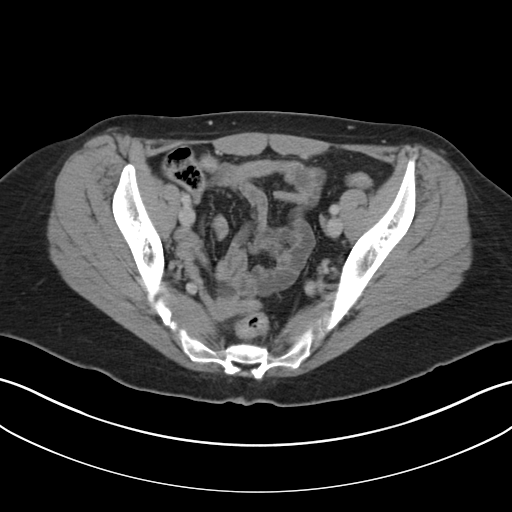
[im 37/98  soft-tissue]
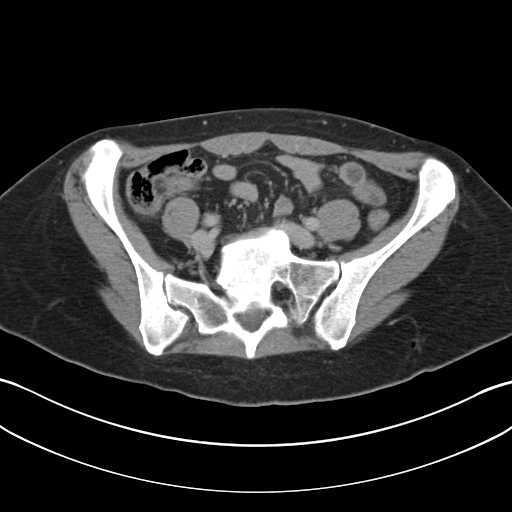
[im 47/98  soft-tissue]
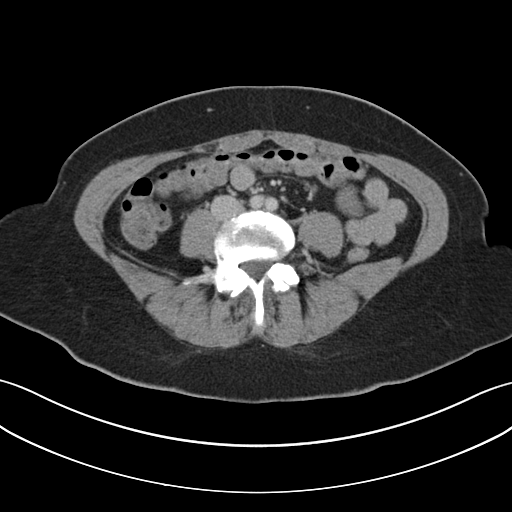
[im 51/98  soft-tissue]
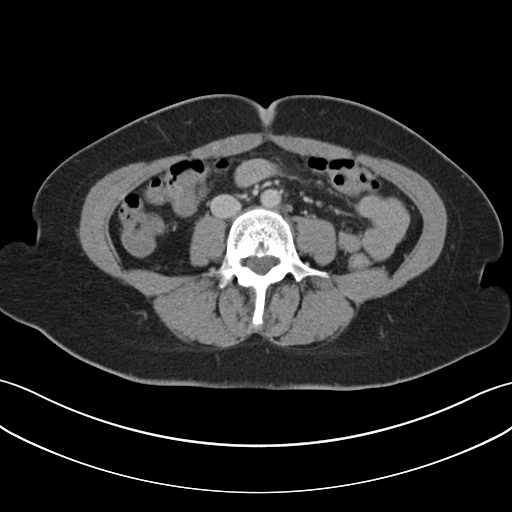
[im 61/98  soft-tissue]
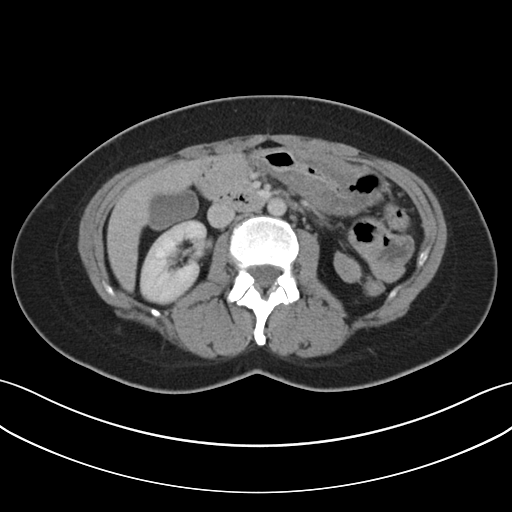
[im 70/98  soft-tissue]
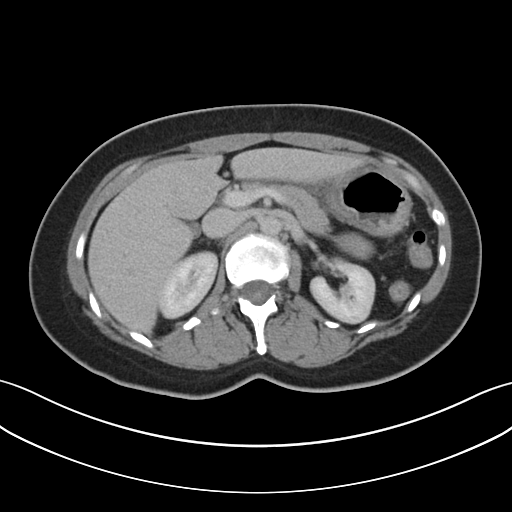
[im 70/98  bone]
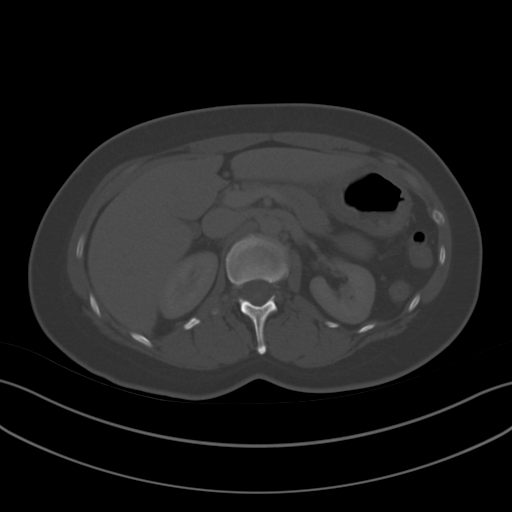
[im 74/98  soft-tissue]
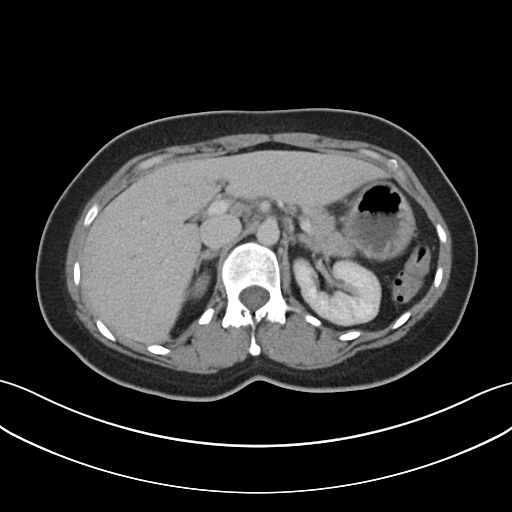
[im 84/98  soft-tissue]
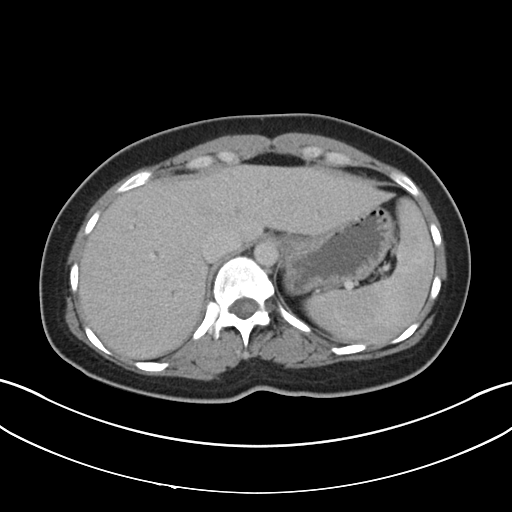
[im 93/98  soft-tissue]
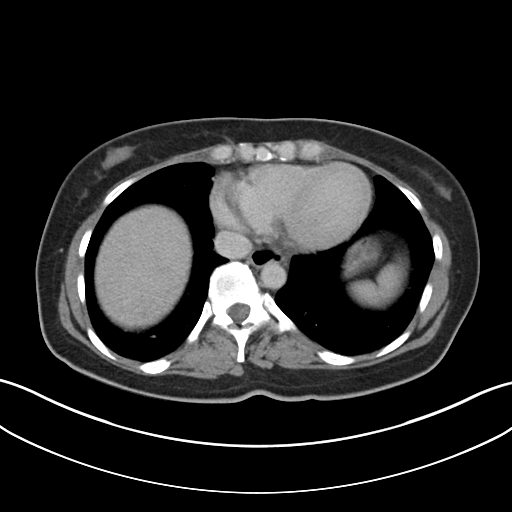

[Series 3: coronal a/|p · coronal · 0.74mm/px · 3 of 104 slices shown]
[im 35/104  soft-tissue]
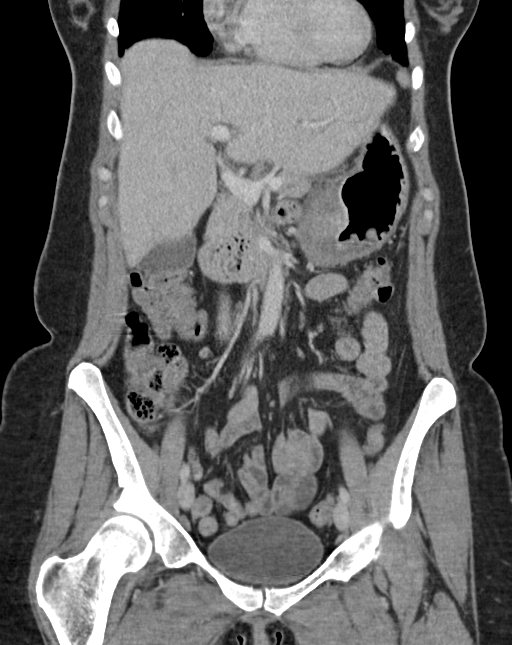
[im 46/104  soft-tissue]
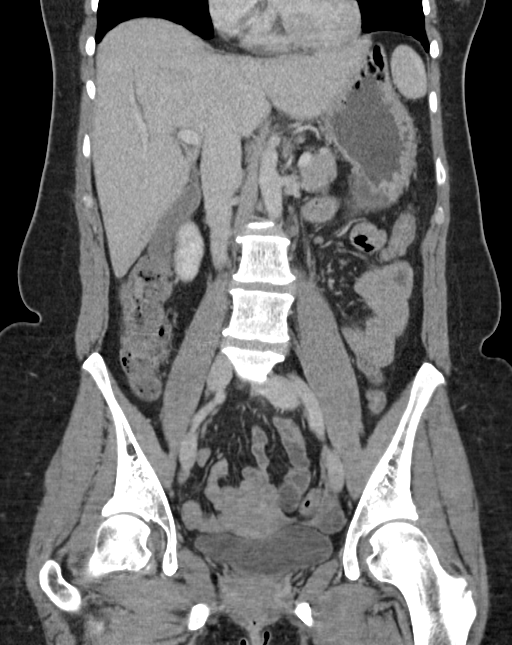
[im 58/104  soft-tissue]
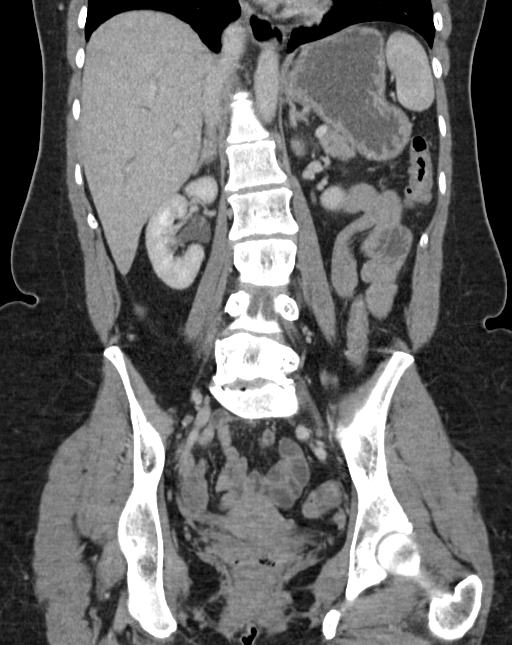

[15 of 46 positions shown; findings below may reference images not displayed]

FINDINGS: Lower chest: Minimal bibasilar atelectasis is noted. The visualized
portions of the mediastinum are unremarkable.

Hepatobiliary: The liver is unremarkable in appearance. The
gallbladder is unremarkable in appearance. The common bile duct
remains normal in caliber.

Pancreas: The pancreas is within normal limits.

Spleen: The spleen is unremarkable in appearance.

Adrenals/Urinary Tract: The adrenal glands are unremarkable in
appearance. The kidneys are within normal limits. There is no
evidence of hydronephrosis. No renal or ureteral stones are
identified. No perinephric stranding is seen.

Stomach/Bowel: Circumferential wall thickening is noted along the
body of the stomach, with scattered adjacent prominent nodes,
measuring up to 7 mm in size. This raises concern for malignancy.
The appearance is less typical for gastritis. Endoscopy is
recommended for further evaluation.

The small bowel is within normal limits. The appendix is normal in
caliber, without evidence of appendicitis. The colon is unremarkable
in appearance.

Vascular/Lymphatic: The abdominal aorta is unremarkable in
appearance. The inferior vena cava is grossly unremarkable. No
retroperitoneal lymphadenopathy is seen. No pelvic sidewall
lymphadenopathy is identified.

Reproductive: The bladder is mildly distended grossly unremarkable.
The uterus is unremarkable in appearance. A 4.7 cm right adnexal
cystic lesion is noted. The ovaries are otherwise grossly
unremarkable.

Other: No additional soft tissue abnormalities are seen.

Musculoskeletal: No acute osseous abnormalities are identified.
Vacuum phenomenon is noted at L5-S1. The visualized musculature is
unremarkable in appearance.
IMPRESSION: 1. Circumferential wall thickening along the body of the stomach,
with scattered adjacent prominent nodes, raising concern for gastric
malignancy. The appearance is less typical for gastritis. Endoscopy
is recommended for further evaluation.
2. 4.7 cm right adnexal cystic lesion is likely physiologic, though
pelvic ultrasound could be considered for further evaluation, when
and as deemed clinically appropriate.
These results were called by telephone at the time of interpretation
on 11/20/2016 at [DATE] to Dr. AUSKARAI ALVIKAS, who verbally
acknowledged these results.

## 2023-01-07 ENCOUNTER — Other Ambulatory Visit (HOSPITAL_BASED_OUTPATIENT_CLINIC_OR_DEPARTMENT_OTHER): Payer: Self-pay

## 2023-01-07 MED ORDER — WEGOVY 0.25 MG/0.5ML ~~LOC~~ SOAJ
0.2500 mg | SUBCUTANEOUS | 0 refills | Status: AC
Start: 2023-01-07 — End: ?
  Filled 2023-01-07: qty 2, 28d supply, fill #0
